# Patient Record
Sex: Female | Born: 2000 | Race: White | Hispanic: No | Marital: Single | Smoking: Current every day smoker
Health system: Southern US, Community
[De-identification: ages and names within clinical notes are randomized; demographics above are authoritative.]

## PROBLEM LIST (undated history)

## (undated) DIAGNOSIS — R002 Palpitations: Secondary | ICD-10-CM

## (undated) DIAGNOSIS — G43909 Migraine, unspecified, not intractable, without status migrainosus: Secondary | ICD-10-CM

## (undated) DIAGNOSIS — F419 Anxiety disorder, unspecified: Secondary | ICD-10-CM

## (undated) HISTORY — DX: Migraine, unspecified, not intractable, without status migrainosus: G43.909

## (undated) HISTORY — DX: Anxiety disorder, unspecified: F41.9

## (undated) HISTORY — DX: Palpitations: R00.2

---
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
## 2018-01-11 ENCOUNTER — Other Ambulatory Visit: Payer: Self-pay | Admitting: Family Medicine

## 2018-01-11 ENCOUNTER — Telehealth: Payer: Self-pay | Admitting: Nurse Practitioner

## 2018-01-11 DIAGNOSIS — F419 Anxiety disorder, unspecified: Secondary | ICD-10-CM

## 2018-01-11 NOTE — Telephone Encounter (Signed)
Pt needs appointment to receive any additional refills. 2 week supply is provided.

## 2018-01-11 NOTE — Telephone Encounter (Signed)
Will you please call and see if patient can come sometimes this next week for follow-up for lexapro.

## 2018-01-14 NOTE — Telephone Encounter (Signed)
°  Called [phone] was not able to leave message due to voice mail not being set up.

## 2018-01-29 ENCOUNTER — Other Ambulatory Visit: Payer: Self-pay | Admitting: Family Medicine

## 2018-01-29 DIAGNOSIS — F419 Anxiety disorder, unspecified: Secondary | ICD-10-CM

## 2018-01-30 NOTE — Telephone Encounter (Signed)
7-day supply provided.  She has already been contacted twice, please call again to set up an appointment. After this 7 day supply we will not be providing any additional refills until she comes for an appointment.

## 2018-02-13 NOTE — Telephone Encounter (Signed)
Called patient--voice mail box not set up

## 2019-03-06 ENCOUNTER — Other Ambulatory Visit: Payer: Self-pay | Admitting: Physician Assistant

## 2019-03-07 NOTE — Telephone Encounter (Signed)
Patient with last PE 01/2018.  Needs in person visit prior to more refills after this one.

## 2019-03-11 ENCOUNTER — Ambulatory Visit (LOCAL_COMMUNITY_HEALTH_CENTER): Payer: Medicaid Other | Admitting: Family Medicine

## 2019-03-11 ENCOUNTER — Encounter: Payer: Self-pay | Admitting: Family Medicine

## 2019-03-11 ENCOUNTER — Other Ambulatory Visit: Payer: Self-pay

## 2019-03-11 VITALS — BP 124/79 | Ht 63.0 in | Wt 150.0 lb

## 2019-03-11 DIAGNOSIS — Z3009 Encounter for other general counseling and advice on contraception: Secondary | ICD-10-CM | POA: Diagnosis not present

## 2019-03-11 DIAGNOSIS — Z30011 Encounter for initial prescription of contraceptive pills: Secondary | ICD-10-CM | POA: Diagnosis not present

## 2019-03-11 DIAGNOSIS — Z3041 Encounter for surveillance of contraceptive pills: Secondary | ICD-10-CM

## 2019-03-11 MED ORDER — NORGESTIMATE-ETH ESTRADIOL 0.25-35 MG-MCG PO TABS: 1.0000 | ORAL_TABLET | Freq: Every day | ORAL | 11 refills | Status: DC

## 2019-03-11 NOTE — Progress Notes (Signed)
Pt here for more birth control pills. Pt reports the only time she missed her OCP's was back in 05/2018 when she lost her pills and missed pills for about 1 month. Pt reports having regular periods since then.Ronny Bacon, RN

## 2019-03-11 NOTE — Progress Notes (Signed)
   [place] problem visit  [place] Department  Subjective:  Carly Carlson is a 18 y.o. being seen today for   Chief Complaint  Patient presents with  . Contraception    birth control pills    HPI  Here for birth control pills.  No concerns voiced.   Does the patient have a current or past history of drug use? No   No components found for: HCV]   Health Maintenance Due  Topic Date Due  . HIV Screening  09/28/2013  . TETANUS/TDAP  09/28/2017  . INFLUENZA VACCINE  12/21/2018    ROS  The following portions of the patient's history were reviewed and updated as appropriate: allergies, current medications, past family history, past medical history, past social history, past surgical history and problem list. Problem list updated.   See flowsheet for other program required questions.  Objective:   Vitals:   03/11/19 0925  BP: 124/79  Weight: 150 lb (68 kg)  Height: 5\' 3"  (1.6 m)    Physical Exam  Not indicated.    Assessment and Plan:  Carly Carlson is a 18 y.o. female presenting to the [facility] Department for a Women's Health problem visit  1. General counseling and advice on contraceptive management   2. Surveillance of previously prescribed contraceptive pill - norgestimate-ethinyl estradiol (ORTHO-CYCLEN) 0.25-35 MG-MCG tablet; Take 1 tablet by mouth daily.  Dispense: 1 Package; Refill: 11     No follow-ups on file.  No future appointments.  Hassell Done, FNP

## 2019-03-12 ENCOUNTER — Encounter: Payer: Self-pay | Admitting: Family Medicine

## 2019-03-12 MED ORDER — NORGESTIMATE-ETH ESTRADIOL 0.25-35 MG-MCG PO TABS: 1.0000 | ORAL_TABLET | Freq: Every day | ORAL | 11 refills | Status: DC

## 2019-03-12 NOTE — Progress Notes (Signed)
Pt left prior to [employer]. Per Hassell Done, FNP rx for OCP's was sent to pt's pharmacy on file.Ronny Bacon, RN

## 2019-03-15 IMAGING — CR DG CHEST 2V
2 series · 2 of 2 positions shown · non-contrast
Comparison: None.

CLINICAL DATA: Central chest pain

EXAM:
CHEST  2 VIEW

[chest pa]
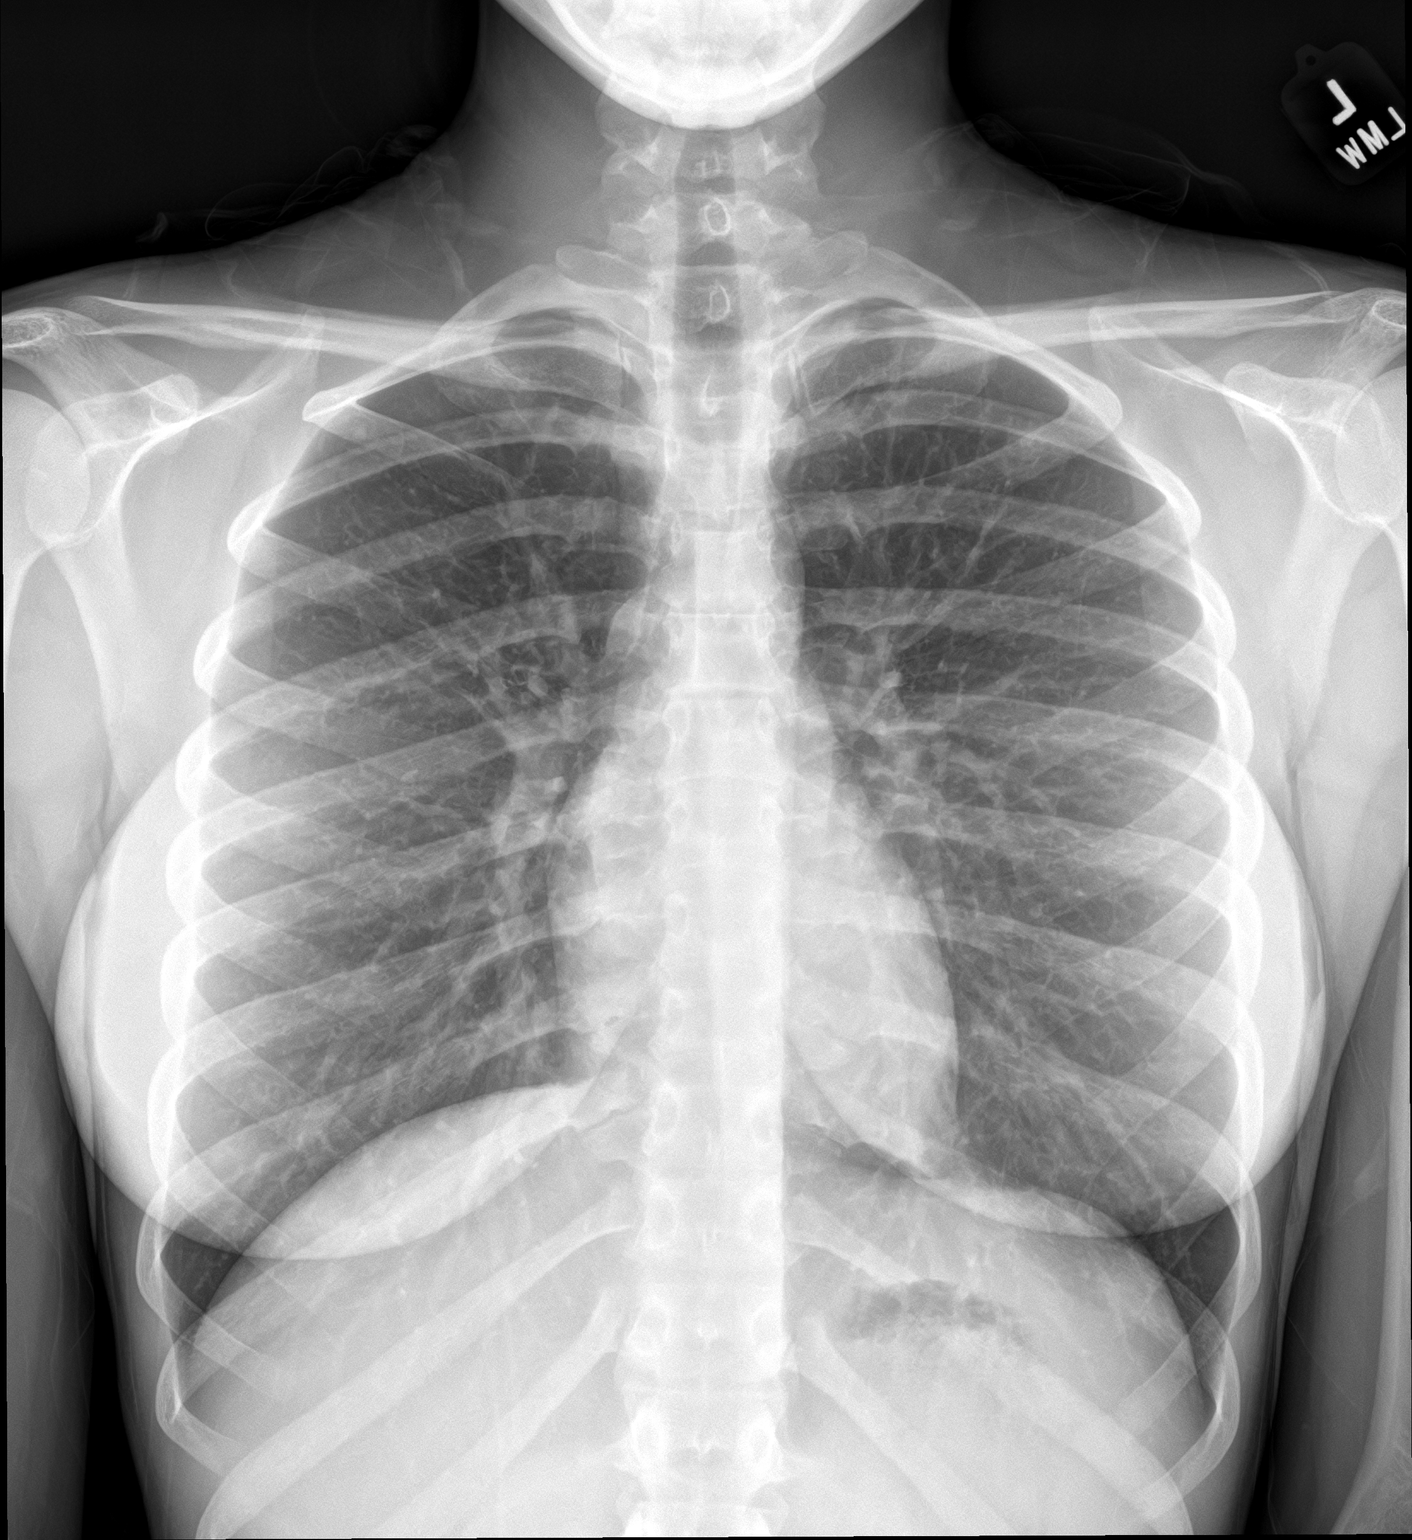

[chest lat]
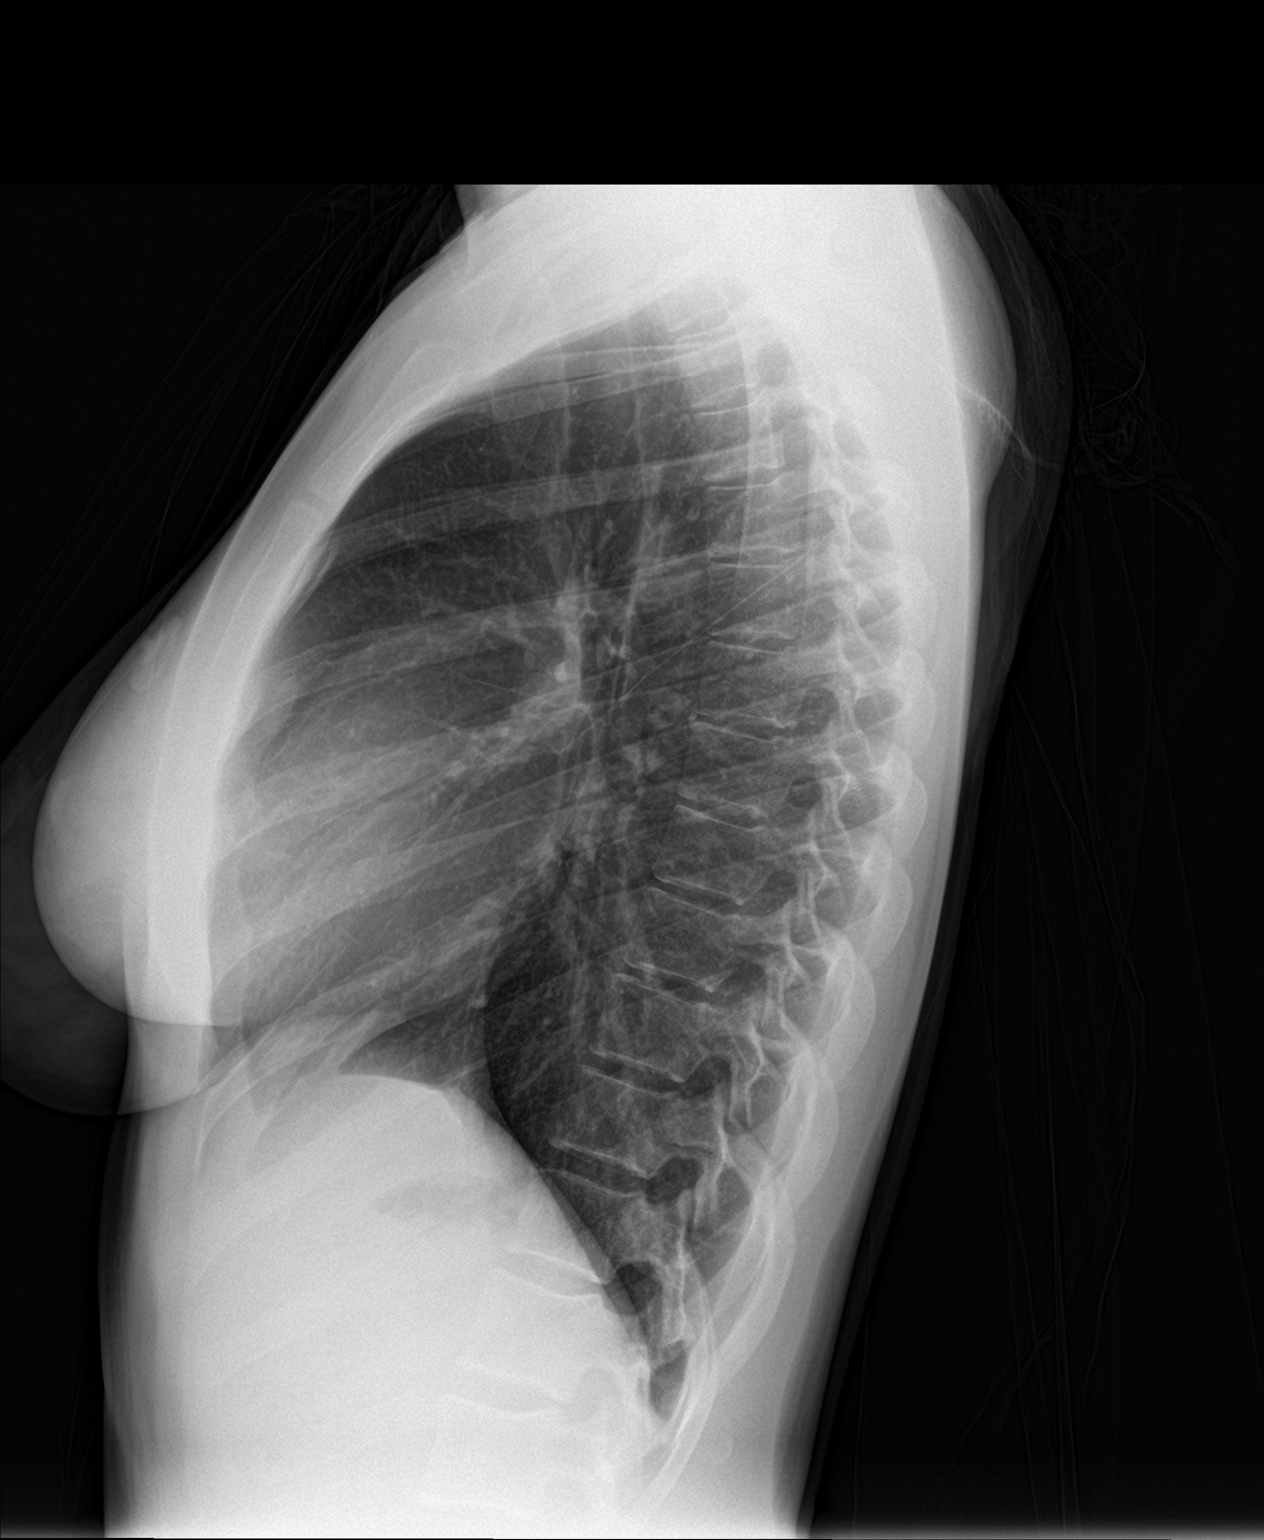

[2 of 2 positions shown; findings below may reference images not displayed]

FINDINGS: The heart size and mediastinal contours are within normal limits.
Both lungs are clear. The visualized skeletal structures are
unremarkable.
IMPRESSION: No active cardiopulmonary disease.

## 2019-03-16 IMAGING — CR DG CHEST 2V
1 series · 2 of 2 positions shown · non-contrast
Comparison: 07/12/2017

CLINICAL DATA: Rapid heart rate

EXAM:
CHEST  2 VIEW

[Series 1: dg chest 2 view · 0.14mm/px · 2 of 2 slices shown]
[im 1/2]
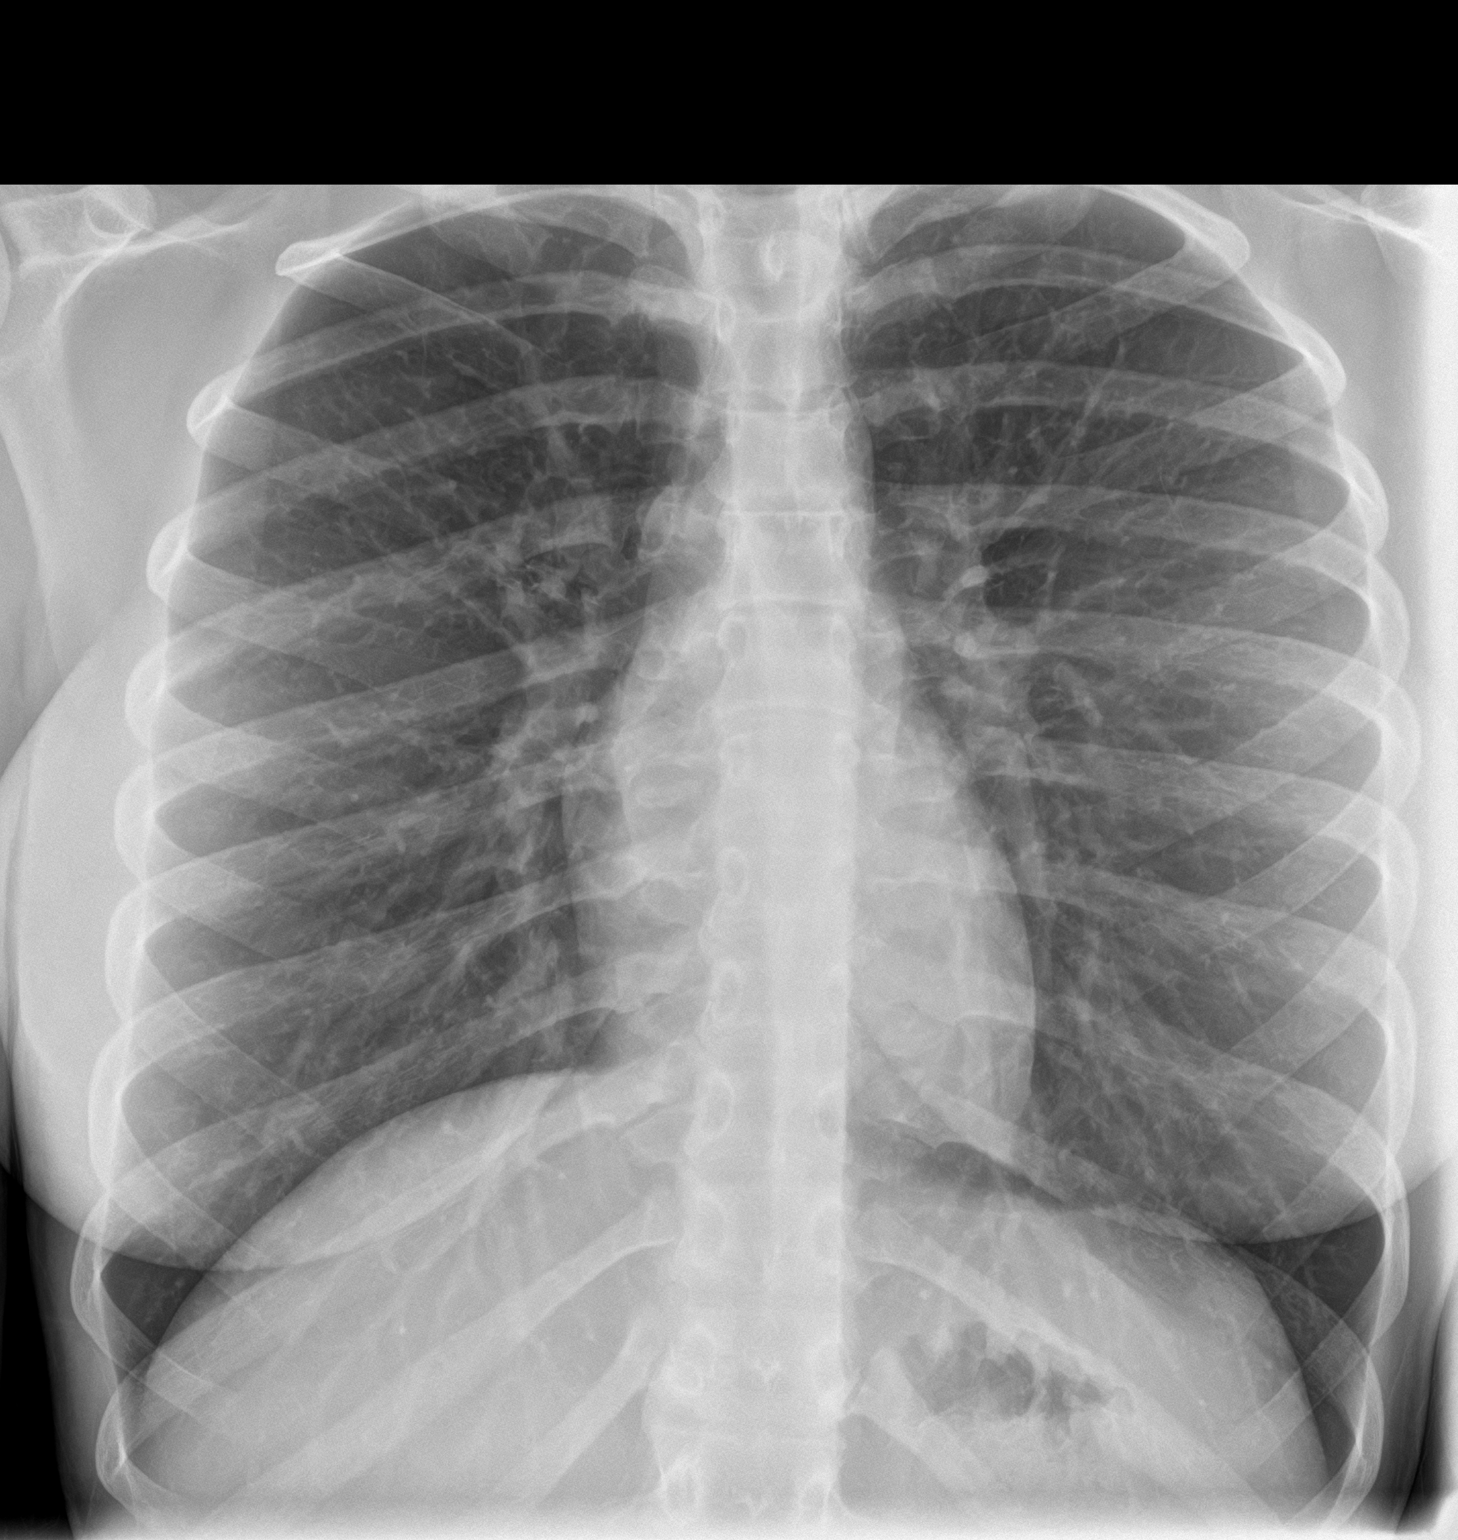
[im 2/2]
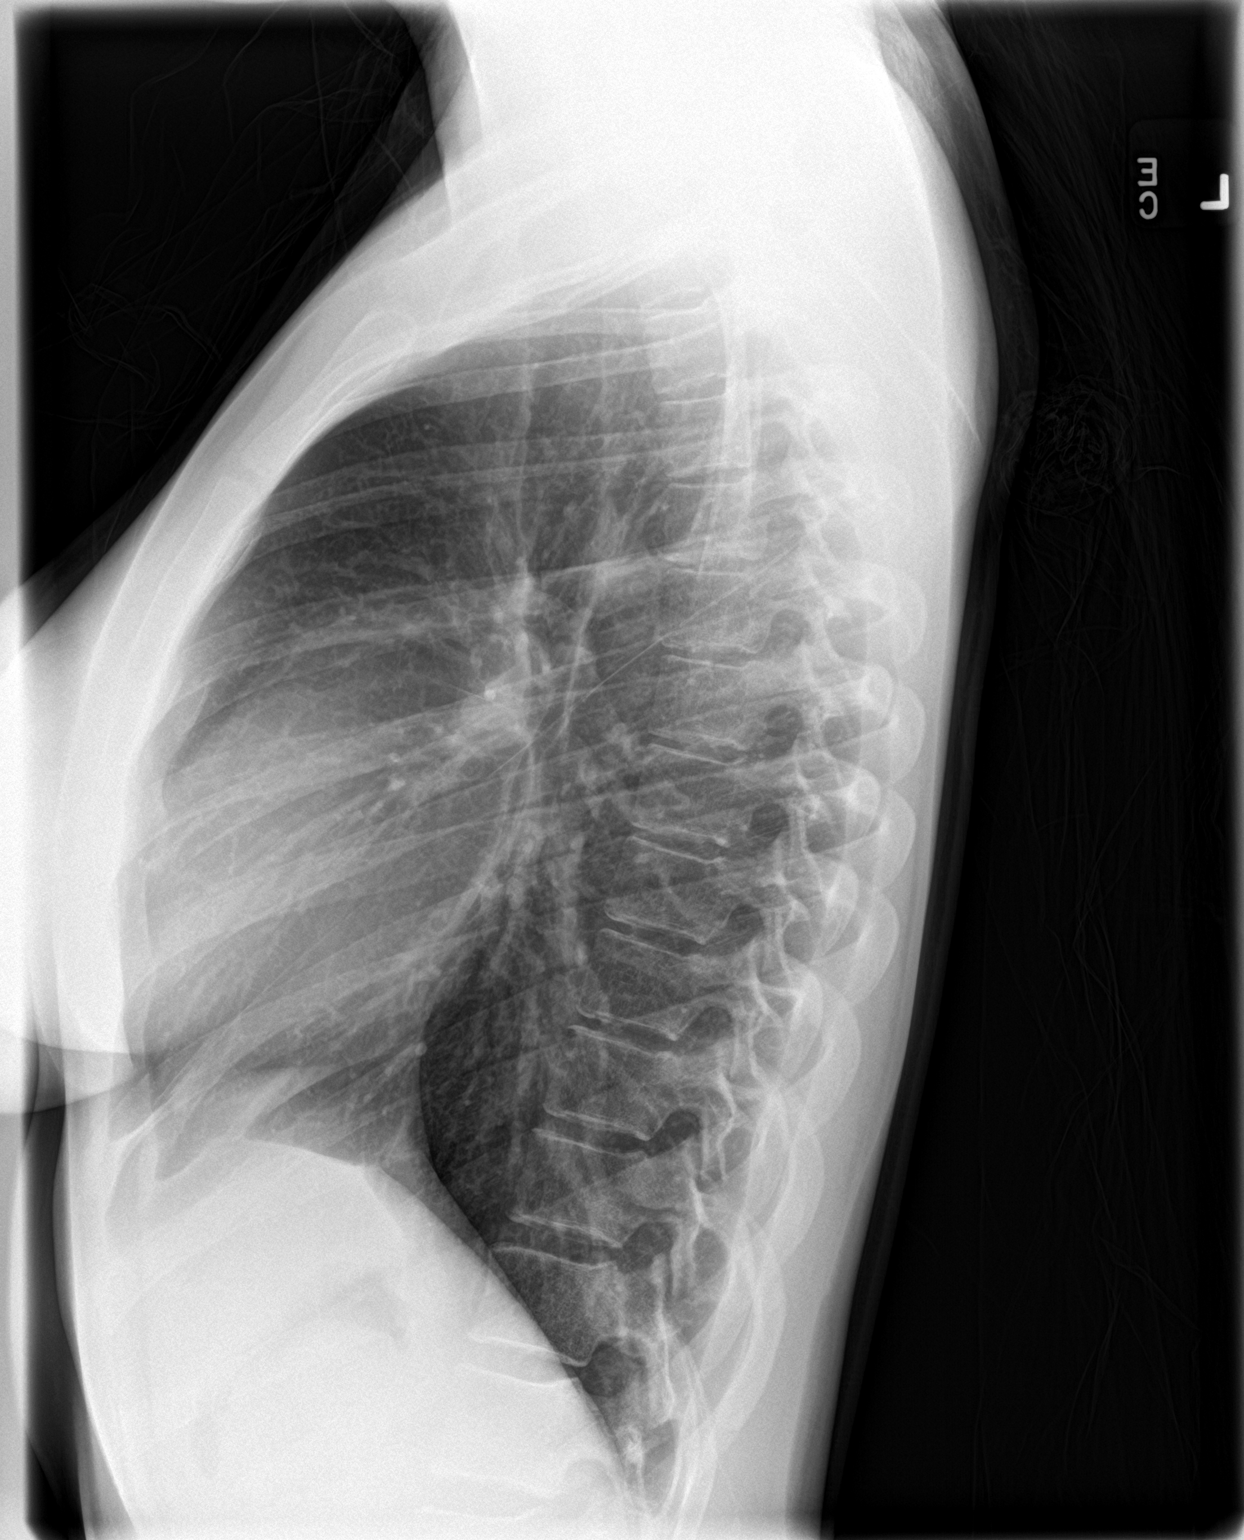

[2 of 2 positions shown; findings below may reference images not displayed]

FINDINGS: Mild central airways thickening which may be seen with reactive
airways. No focal pulmonary opacity or effusion. Normal heart size.
No pneumothorax.
IMPRESSION: No active cardiopulmonary disease.

## 2020-04-04 ENCOUNTER — Other Ambulatory Visit: Payer: Self-pay | Admitting: Physician Assistant

## 2020-04-04 DIAGNOSIS — Z3041 Encounter for surveillance of contraceptive pills: Secondary | ICD-10-CM

## 2020-04-04 NOTE — Telephone Encounter (Signed)
Per chart review, patient with last RP 01/2018 and last in-person visit 03/11/2019.  Will authorize one refill for patient but will need an in-person RP visit prior to further refills.

## 2020-04-28 ENCOUNTER — Other Ambulatory Visit: Payer: Self-pay

## 2020-04-28 ENCOUNTER — Ambulatory Visit (LOCAL_COMMUNITY_HEALTH_CENTER): Payer: BC Managed Care – PPO | Admitting: Advanced Practice Midwife

## 2020-04-28 ENCOUNTER — Ambulatory Visit: Payer: Medicaid Other

## 2020-04-28 VITALS — BP 137/90 | HR 80 | Ht 62.0 in | Wt 147.6 lb

## 2020-04-28 DIAGNOSIS — E663 Overweight: Secondary | ICD-10-CM | POA: Diagnosis not present

## 2020-04-28 DIAGNOSIS — R030 Elevated blood-pressure reading, without diagnosis of hypertension: Secondary | ICD-10-CM | POA: Diagnosis not present

## 2020-04-28 DIAGNOSIS — G43909 Migraine, unspecified, not intractable, without status migrainosus: Secondary | ICD-10-CM | POA: Insufficient documentation

## 2020-04-28 DIAGNOSIS — Z3041 Encounter for surveillance of contraceptive pills: Secondary | ICD-10-CM

## 2020-04-28 DIAGNOSIS — Z3009 Encounter for other general counseling and advice on contraception: Secondary | ICD-10-CM

## 2020-04-28 DIAGNOSIS — Z720 Tobacco use: Secondary | ICD-10-CM | POA: Insufficient documentation

## 2020-04-28 LAB — WET PREP FOR TRICH, YEAST, CLUE
Trichomonas Exam: NEGATIVE
Yeast Exam: NEGATIVE

## 2020-04-28 MED ORDER — NORETHINDRONE 0.35 MG PO TABS: 1.0000 | ORAL_TABLET | Freq: Every day | ORAL | 0 refills | Status: DC

## 2020-04-28 NOTE — Progress Notes (Signed)
[facility] DEPARTMENT [facility] [address] Main Number: [phone]    Family Planning Visit- Initial Visit  Subjective:  Carly Carlson is a 19 y.o. SWF vaper  [id number]   being seen today for an initial well woman visit and to discuss family planning options.  She is currently using Combination OCPs for pregnancy prevention. Patient reports she does not want a pregnancy in the next year.  Patient has the following medical conditions has Anxiety; Disturbed sleep rhythm; and Overweight BMI=27 on their problem list.  Chief Complaint  Patient presents with  . Annual Exam    Patient reports LMP mid November 2021.  Last sex 04/25/20 without condom; with current partner x 5 years; 1 partner in last 3 mo.  Last ETOH 02/2020 (4 beers) 1x/mo.  Last cigar 4 years ago.  Vaper.  Onset coitus age 9 with 2 sex lifetime partners.  Living with mom and 24 yo brother.  Working 35-40 hours/wk.  Last MJ 5 years ago.  Patient denies cigs  Body mass index is 27 kg/m. - Patient is eligible for diabetes screening based on BMI and age >41?  not applicable HA1C ordered? not applicable  Patient reports 1 of partners in last year. Desires STI screening?  Yes  Has patient been screened once for HCV in the past?  No  No results found for: HCVAB  Does the patient have current drug use (including MJ), have a partner with drug use, and/or has been incarcerated since last result? No  If yes-- Screen for HCV through [facility]   Does the patient meet criteria for HBV testing? No  Criteria:  -Household, sexual or needle sharing contact with HBV -History of drug use -HIV positive -Those with known Hep C   Health Maintenance Due  Topic Date Due  . Hepatitis C Screening  Never done  . COVID-19 Vaccine (1) Never done  . HIV Screening  Never done  . TETANUS/TDAP  Never done  . PAP-Cervical Cytology Screening  Never done  . PAP SMEAR-Modifier  Never done  .  INFLUENZA VACCINE  Never done    Review of Systems  Neurological: Positive for headaches (q month in different places on head, relieved with Ibuprofen, -N&V,-audio,-visual).  All other systems reviewed and are negative.   The following portions of the patient's history were reviewed and updated as appropriate: allergies, current medications, past family history, past medical history, past social history, past surgical history and problem list. Problem list updated.   See flowsheet for other program required questions.  Objective:   Vitals:   04/28/20 1113 04/28/20 1134  BP: (!) 146/99 137/90  Pulse: (!) 106 80  Weight: 147 lb 9.6 oz (67 kg)   Height: 5\' 2"  (1.575 m)     Physical Exam Constitutional:      Appearance: Normal appearance. She is normal weight.  HENT:     Head: Normocephalic and atraumatic.     Mouth/Throat:     Mouth: Mucous membranes are moist.  Eyes:     Conjunctiva/sclera: Conjunctivae normal.  Cardiovascular:     Rate and Rhythm: Normal rate and regular rhythm.  Pulmonary:     Effort: Pulmonary effort is normal.     Breath sounds: Normal breath sounds.  Chest:     Breasts:        Right: Normal.        Left: Normal.  Abdominal:     Palpations: Abdomen is soft.  Comments: Soft without masses or tenderness, good tone  Genitourinary:    General: Normal vulva.     Exam position: Lithotomy position.     Vagina: Vaginal discharge (white creamy leukorrhea, ph<4.5) present.     Cervix: Normal.     Uterus: Normal.      Adnexa: Right adnexa normal and left adnexa normal.     Rectum: Normal.  Musculoskeletal:        General: Normal range of motion.     Cervical back: Normal range of motion and neck supple.  Skin:    General: Skin is warm and dry.  Neurological:     Mental Status: She is alert.  Psychiatric:        Mood and Affect: Mood normal.       Assessment and Plan:  Carly Carlson is a 19 y.o. female presenting to the [facility] Department for an initial well woman exam/family planning visit  Contraception counseling: Reviewed all forms of birth control options in the tiered based approach. available including abstinence; over the counter/barrier methods; hormonal contraceptive medication including pill, patch, ring, injection,contraceptive implant, ECP; hormonal and nonhormonal IUDs; permanent sterilization options including vasectomy and the various tubal sterilization modalities. Risks, benefits, and typical effectiveness rates were reviewed.  Questions were answered.  Written information was also given to the patient to review.  Patient desires ocp's, this was prescribed for patient. She will follow up in 2 wks for surveillance.  She was told to call with any further questions, or with any concerns about this method of contraception.  Emphasized use of condoms 100% of the time for STI prevention.  Patient was offered ECP. ECP was not accepted by the patient. ECP counseling was not given - see RN documentation  1. Overweight BMI=27   2. Family planning Treat wet mount per standing order Immunization nurse consult Please give pt primary care MD list Please write both BP's down for pt to give to primary care MD to f/u RTC 2 wks for BP check and ask if has Medicaid.  If has Medicaid then needs e-rx for Micronor for 1 year.  If no Medicaid then will need to be given 1 years worth of Micronor Counseled via 5 A's to stop vaping - WET PREP FOR TRICH, YEAST, CLUE - Chlamydia/Gonorrhea [facility] - Pap IG (Image Guided)  3. Encounter for surveillance of contraceptive pills Micronor #13 I po daily to begin when completes this current pack     No follow-ups on file.  No future appointments.  Alberteen Spindle, CNM

## 2020-04-29 LAB — PAP IG (IMAGE GUIDED): PAP Smear Comment: 0

## 2020-05-12 ENCOUNTER — Other Ambulatory Visit: Payer: Self-pay

## 2020-05-12 ENCOUNTER — Ambulatory Visit (LOCAL_COMMUNITY_HEALTH_CENTER): Payer: BC Managed Care – PPO | Admitting: Physician Assistant

## 2020-05-12 VITALS — BP 143/88 | HR 105 | Wt 144.8 lb

## 2020-05-12 DIAGNOSIS — Z30011 Encounter for initial prescription of contraceptive pills: Secondary | ICD-10-CM | POA: Diagnosis not present

## 2020-05-12 DIAGNOSIS — Z3009 Encounter for other general counseling and advice on contraception: Secondary | ICD-10-CM | POA: Diagnosis not present

## 2020-05-12 MED ORDER — NORETHINDRONE 0.35 MG PO TABS: 1.0000 | ORAL_TABLET | Freq: Every day | ORAL | 11 refills | Status: DC

## 2020-05-12 NOTE — Progress Notes (Signed)
Folic acid counseling completed. Reports still has PCP list received at visit on 04/28/2020. Jossie Ng, RN

## 2020-05-12 NOTE — Progress Notes (Signed)
S: Pt here for f/u contraception visit. Started progestin-only pill (Micronor) 04/28/20 in light of elevated BP and vaping nicotine. Has been taking at 9pm without missed doses, no side effects. Wants to continue. Has not had PCP eval of elevated BP yet. Notes h/o anxiety-related BP elevation, was on Lexapro for 3 mo about 2 years ago for this, but discontinued due to loss of medical insurance. Now has insurance for contraception. Feels well today, no new concerns. O: WNWD young woman in NAD. Vital signs reviewed with BP 143/88 (was 137/90 at 12/8 visit). A/P: 1. Family planning services Enc to have PCP eval of elevated BP, and discontinue vaping. Annual well-woman exam due in 1 year. - norethindrone (MICRONOR) 0.35 MG tablet; Take 1 tablet (0.35 mg total) by mouth daily.  Dispense: 28 tablet; Refill: 11

## 2020-07-16 ENCOUNTER — Other Ambulatory Visit: Payer: Self-pay | Admitting: Physician Assistant

## 2020-07-16 DIAGNOSIS — Z3009 Encounter for other general counseling and advice on contraception: Secondary | ICD-10-CM

## 2021-07-07 ENCOUNTER — Other Ambulatory Visit: Payer: Self-pay | Admitting: Family Medicine

## 2021-07-07 ENCOUNTER — Telehealth: Payer: Self-pay | Admitting: Family Medicine

## 2021-07-07 DIAGNOSIS — Z3041 Encounter for surveillance of contraceptive pills: Secondary | ICD-10-CM

## 2021-07-07 MED ORDER — NORETHINDRONE 0.35 MG PO TABS: 1.0000 | ORAL_TABLET | Freq: Every day | ORAL | 0 refills | Status: DC

## 2021-07-07 NOTE — Telephone Encounter (Signed)
I ran out of [facility] pills yesterday but my PE isn't until March 1. Any suggestions?

## 2021-07-07 NOTE — Progress Notes (Signed)
Pt called and ran out of OCP's, pt is scheduled for PE on 07/20/2021.    Pt informed of need of PE before more pills can be prescribed.   1. Encounter for surveillance of contraceptive pills - norethindrone (MICRONOR) 0.35 MG tablet; Take 1 tablet (0.35 mg total) by mouth daily.  Dispense: 28 tablet; Refill: 0   Wendi Snipes, FNP

## 2021-07-20 ENCOUNTER — Ambulatory Visit: Payer: BC Managed Care – PPO

## 2021-09-25 ENCOUNTER — Other Ambulatory Visit: Payer: Self-pay | Admitting: Family Medicine

## 2021-09-25 DIAGNOSIS — Z3009 Encounter for other general counseling and advice on contraception: Secondary | ICD-10-CM

## 2021-09-26 ENCOUNTER — Telehealth: Payer: Self-pay | Admitting: Family Medicine

## 2021-09-26 NOTE — Telephone Encounter (Signed)
Trying to reschedule appt but my job willnot let me off work ?

## 2021-09-26 NOTE — Telephone Encounter (Signed)
Telephone call to patient regarding her desires for more OCP's and the need for a PE prior to getting a OCP Rx.  Patient reports she has a difficult time getting approved for time off work.  She has 3 different days she can come and a short timeframe, but she only has 3 OCP's left.  Patient has been scheduled for Wednesday 09-28-2021 at 9:40 am (9:10 arrival time).  Hart Carwin, RN ? ?

## 2021-09-28 ENCOUNTER — Ambulatory Visit (LOCAL_COMMUNITY_HEALTH_CENTER): Payer: BC Managed Care – PPO | Admitting: Nurse Practitioner

## 2021-09-28 ENCOUNTER — Encounter: Payer: Self-pay | Admitting: Nurse Practitioner

## 2021-09-28 VITALS — BP 131/83 | Ht 62.0 in | Wt 162.0 lb

## 2021-09-28 DIAGNOSIS — Z0000 Encounter for general adult medical examination without abnormal findings: Secondary | ICD-10-CM | POA: Diagnosis not present

## 2021-09-28 DIAGNOSIS — Z3009 Encounter for other general counseling and advice on contraception: Secondary | ICD-10-CM | POA: Diagnosis not present

## 2021-09-28 MED ORDER — NORETHINDRONE 0.35 MG PO TABS: 1.0000 | ORAL_TABLET | Freq: Every day | ORAL | 12 refills | Status: DC

## 2021-09-28 NOTE — Progress Notes (Signed)
[facility] DEPARTMENT ?Family Planning Clinic ?[address] [employer] ?Main Number: [phone] ? ? ? ?Family Planning Visit- Initial Visit ? ?Subjective:  ?Carly Carlson is a 21 y.o.  G0P0000   being seen today for an initial annual visit and to discuss reproductive life planning.  The patient is currently using Oral Contraceptive for pregnancy prevention. Patient reports   does not want a pregnancy in the next year.   ? ? report they are looking for a method that provides Cycle control, High efficacy at preventing pregnancy, and Method they can control starting/stopping ? ?Patient has the following medical conditions has Anxiety; Disturbed sleep rhythm; Overweight BMI=27; Elevated blood pressure reading 04/28/20  146/99, 137/90; Migraines; and Vapes nicotine containing substance on their problem list. ? ?Chief Complaint  ?Patient presents with  ? Annual Exam  ?  Annual exam and birth control  ? ? ?Patient reports to clinic today for a physical and birth control.  ? ?Patient denies signs and symptoms   ? ?Body mass index is 29.63 kg/m?. - Patient is eligible for diabetes screening based on BMI and age >44?  not applicable ?HA1C ordered? not applicable ? ?Patient reports 1  partner/s in last year. Desires STI screening?  Yes ? ?Has patient been screened once for HCV in the past?  No ? No results found for: HCVAB ? ?Does the patient have current drug use (including MJ), have a partner with drug use, and/or has been incarcerated since last result? No  ?If yes-- Screen for HCV through [facility] ?  ?Does the patient meet criteria for HBV testing? Yes, history of use, not currently using.  ? ?Criteria:  ?-Household, sexual or needle sharing contact with HBV ?-History of drug use ?-HIV positive ?-Those with known Hep C ? ? ?Health Maintenance Due  ?Topic Date Due  ? COVID-19 Vaccine (1) Never done  ? HPV VACCINES (1 - 2-dose series) Never done  ? HIV Screening  Never done  ? Hepatitis C Screening   Never done  ? TETANUS/TDAP  Never done  ? ? ?Review of Systems  ?Constitutional:  Negative for chills, fever, malaise/fatigue and weight loss.  ?HENT:  Negative for congestion, hearing loss and sore throat.   ?Eyes:  Negative for blurred vision, double vision and photophobia.  ?Respiratory:  Negative for shortness of breath.   ?Cardiovascular:  Negative for chest pain.  ?Gastrointestinal:  Negative for abdominal pain, blood in stool, constipation, diarrhea, heartburn, nausea and vomiting.  ?Genitourinary:  Negative for dysuria and frequency.  ?Musculoskeletal:  Negative for back pain, joint pain and neck pain.  ?Skin:  Negative for itching and rash.  ?Neurological:  Negative for dizziness, weakness and headaches.  ?Endo/Heme/Allergies:  Does not bruise/bleed easily.  ?Psychiatric/Behavioral:  Negative for depression, substance abuse and suicidal ideas.   ? ?The following portions of the patient's history were reviewed and updated as appropriate: allergies, current medications, past family history, past medical history, past social history, past surgical history and problem list. Problem list updated. ? ? ?See flowsheet for other program required questions. ? ?Objective:  ? ?Vitals:  ? 09/28/21 0941  ?BP: 131/83  ?Weight: 162 lb (73.5 kg)  ?Height: 5\' 2"  (1.575 m)  ? ? ?Physical Exam ?Constitutional:   ?   Appearance: Normal appearance.  ?HENT:  ?   Head: Normocephalic.  ?   Right Ear: External ear normal.  ?   Left Ear: External ear normal.  ?   Nose: Nose  normal.  ?   Mouth/Throat:  ?   Lips: Pink.  ?   Mouth: Mucous membranes are moist.  ?   Comments: No visible signs of dental caries ?Eyes:  ?   Pupils: Pupils are equal, round, and reactive to light.  ?Cardiovascular:  ?   Rate and Rhythm: Normal rate and regular rhythm.  ?Pulmonary:  ?   Effort: Pulmonary effort is normal.  ?   Breath sounds: Normal breath sounds.  ?Chest:  ?   Comments: Breasts:  ?      Right: Normal. No swelling, mass, nipple discharge, skin  change or tenderness.  ?      Left: Normal. No swelling, mass, nipple discharge, skin change or tenderness.   ?Abdominal:  ?   General: Abdomen is flat. Bowel sounds are normal.  ?   Palpations: Abdomen is soft.  ?Genitourinary: ?   Comments: Deferred, declines genital exam ?Musculoskeletal:  ?   Cervical back: Full passive range of motion without pain, normal range of motion and neck supple.  ?Skin: ?   General: Skin is warm and dry.  ?Neurological:  ?   Mental Status: She is alert and oriented to person, place, and time.  ?Psychiatric:     ?   Attention and Perception: Attention normal.     ?   Mood and Affect: Mood normal.     ?   Speech: Speech normal.     ?   Behavior: Behavior normal. Behavior is cooperative.  ? ? ? ? ?Assessment and Plan:  ?Carly Carlson is a 21 y.o. female presenting to the [facility] Department for an initial annual wellness/contraceptive visit ? ?Contraception counseling: Reviewed options based on patient desire and reproductive life plan. Patient is interested in Oral Contraceptive. This was provided to the patient today.  ? ?Risks, benefits, and typical effectiveness rates were reviewed.  Questions were answered.  Written information was also given to the patient to review.   ? ?The patient will follow up in  1 years for surveillance.  The patient was told to call with any further questions, or with any concerns about this method of contraception.  Emphasized use of condoms 100% of the time for STI prevention. ? ?Need for ECP was assessed. No ECP needed due to continuous use of birth control.   ? ?1. Family planning counseling ?-21 year old female in clinic today for a physical and birth control. ?-ROS reviewed, no complaints. ?-Patient desires to continue with birth control method.  Last pill taken on 09/27/21.  Prescription sent for Micronor 1 PO daily #13.   ?-Patient denies STD screening and blood work today. ? ?- norethindrone (MICRONOR) 0.35 MG tablet; Take 1 tablet  (0.35 mg total) by mouth daily.  Dispense: 28 tablet; Refill: 12 ? ? ?2. Well woman exam (no gynecological exam) ?-Normal well woman exam ?-Next CBE 09/2024 ?-PAP due 04/2023 ? ? ? ?Return in about 1 year (around 09/29/2022) for Annual well-woman exam. ? ? ? ?Glenna Fellows, FNP ?

## 2021-09-28 NOTE — Progress Notes (Signed)
Pt here for annual PE and birth control.  Declines STD testing.  Rx for OCPs sent to pharmacy by provider.  Condoms declined.Forest Becker, RN. ?

## 2021-10-03 ENCOUNTER — Ambulatory Visit: Payer: BC Managed Care – PPO

## 2022-08-28 ENCOUNTER — Other Ambulatory Visit: Payer: Self-pay | Admitting: Nurse Practitioner

## 2022-08-28 DIAGNOSIS — Z3009 Encounter for other general counseling and advice on contraception: Secondary | ICD-10-CM

## 2022-08-30 NOTE — Telephone Encounter (Signed)
Patient states that she needs a refill on her contraception. She ran out Monday and stated that she called the pharmacy and she was told that she needed to contact her provider to get a refill. [facility] is [facility] [address].

## 2022-08-31 ENCOUNTER — Telehealth: Payer: Self-pay | Admitting: Family Medicine

## 2022-09-01 ENCOUNTER — Ambulatory Visit (LOCAL_COMMUNITY_HEALTH_CENTER): Payer: BC Managed Care – PPO

## 2022-09-01 VITALS — BP 135/84 | Ht 62.0 in | Wt 154.0 lb

## 2022-09-01 DIAGNOSIS — Z3041 Encounter for surveillance of contraceptive pills: Secondary | ICD-10-CM

## 2022-09-01 DIAGNOSIS — Z3009 Encounter for other general counseling and advice on contraception: Secondary | ICD-10-CM

## 2022-09-01 DIAGNOSIS — Z308 Encounter for other contraceptive management: Secondary | ICD-10-CM | POA: Diagnosis not present

## 2022-09-01 MED ORDER — NORETHINDRONE 0.35 MG PO TABS: 1.0000 | ORAL_TABLET | Freq: Every day | ORAL | 1 refills | Status: AC

## 2022-09-01 NOTE — Progress Notes (Signed)
Client seen in nurse clinic for OCP refill, she reported that took last pill Monday 08/28/2022 and is currently out of pills. Reports that she is taking the pill at the same time every day, and reports no missed pill except,  this week due to being out of pills. (Missed pills Tuesday 4/9, Wednesday 4/10, Thursday 4/11 and today 4/12.  Reports last sex was Wednesday 4/10.  LMP 2nd week in January.   Reports that her period is very irregular.   Consulted with Lenice Llamas, FNP and she recommended that client use home pregnancy test if no period in 2 weeks and call the clinic.  Reminded client she should use a back-up birth control method for 7 days, condoms offered but declined.  Lenice Llamas, [place] called [facility] with one refill.  Client has appointment for Physical Exam on 10/03/2013.   Client verbalized understanding and no further questions.  Carly Brittingham Sherrilyn Rist, RN

## 2022-09-21 ENCOUNTER — Other Ambulatory Visit: Payer: Self-pay | Admitting: Family Medicine

## 2022-09-21 DIAGNOSIS — Z3041 Encounter for surveillance of contraceptive pills: Secondary | ICD-10-CM

## 2022-10-04 ENCOUNTER — Ambulatory Visit: Payer: BC Managed Care – PPO | Admitting: Family Medicine

## 2022-10-04 ENCOUNTER — Encounter: Payer: Self-pay | Admitting: Family Medicine

## 2022-10-04 VITALS — BP 122/72 | HR 67 | Ht 62.0 in | Wt 156.4 lb

## 2022-10-04 DIAGNOSIS — Z308 Encounter for other contraceptive management: Secondary | ICD-10-CM | POA: Diagnosis not present

## 2022-10-04 DIAGNOSIS — Z3009 Encounter for other general counseling and advice on contraception: Secondary | ICD-10-CM

## 2022-10-04 DIAGNOSIS — Z0000 Encounter for general adult medical examination without abnormal findings: Secondary | ICD-10-CM

## 2022-10-04 MED ORDER — NORETHINDRONE 0.35 MG PO TABS: 1.0000 | ORAL_TABLET | Freq: Every day | ORAL | 12 refills | Status: AC

## 2022-10-04 NOTE — Progress Notes (Signed)
[facility] DEPARTMENT [facility] [address] Main Number: [phone]  Family Planning Visit- Repeat Yearly Visit  Subjective:  Carly Carlson is a 22 y.o. G0P0000  being seen today for an annual wellness visit and to discuss contraception options.   The patient is currently using Oral Contraceptive for pregnancy prevention. Patient does not want a pregnancy in the next year.    report they are looking for a method that provides Does not want something inserted   Patient has the following medical problems: has Anxiety; Disturbed sleep rhythm; Overweight BMI=27; Elevated blood pressure reading 04/28/20  146/99, 137/90; Migraines; and Vapes nicotine containing substance on their problem list.  Chief Complaint  Patient presents with   Annual Exam    PE and OCP's    Patient reports to clinic for PE and OCPs   Patient denies concerns about self today.    See flowsheet for other program required questions.   Body mass index is 28.61 kg/m. - Patient is eligible for diabetes screening based on BMI> 25 and age >35?  no HA1C ordered? not applicable  Patient reports 1 of partners in last year. Desires STI screening?  No - declines   Has patient been screened once for HCV in the past?  No  No results found for: "HCVAB"  Does the patient have current of drug use, have a partner with drug use, and/or has been incarcerated since last result? No  If yes-- Screen for HCV through [facility]   Does the patient meet criteria for HBV testing? No  Criteria:  -Household, sexual or needle sharing contact with HBV -History of drug use -HIV positive -Those with known Hep C   Health Maintenance Due  Topic Date Due   COVID-19 Vaccine (1) Never done   HPV VACCINES (1 - 2-dose series) Never done   HIV Screening  Never done   Hepatitis C Screening  Never done   DTaP/Tdap/Td (1 - Tdap) Never done    Review of Systems  Constitutional:  Negative  for weight loss.  Eyes:  Negative for blurred vision.  Respiratory:  Negative for cough and shortness of breath.   Cardiovascular:  Negative for claudication.  Gastrointestinal:  Negative for nausea.  Genitourinary:  Negative for dysuria and frequency.  Skin:  Negative for rash.  Neurological:  Negative for headaches.  Endo/Heme/Allergies:  Does not bruise/bleed easily.    The following portions of the patient's history were reviewed and updated as appropriate: allergies, current medications, past family history, past medical history, past social history, past surgical history and problem list. Problem list updated.  Objective:   Vitals:   10/04/22 0853  BP: 122/72  Pulse: 67  Weight: 156 lb 6.4 oz (70.9 kg)  Height: 5\' 2"  (1.575 m)    Physical Exam Constitutional:      Appearance: Normal appearance.  HENT:     Head: Normocephalic and atraumatic.  Pulmonary:     Effort: Pulmonary effort is normal.  Abdominal:     Palpations: Abdomen is soft.  Genitourinary:    Comments: Declined genital exam- no symptoms Musculoskeletal:        General: Normal range of motion.  Skin:    General: Skin is warm and dry.  Neurological:     General: No focal deficit present.     Mental Status: She is alert.  Psychiatric:        Mood and Affect: Mood normal.  Behavior: Behavior normal.     Assessment and Plan:  Carly Carlson is a 23 y.o. female G0P0000 presenting to the [facility] Department for an yearly wellness and contraception visit  1. Well woman exam (no gynecological exam) CBE due in 2026, last done in 2023 -pap test not due until 04/2023, counseled to RTC in December for repeat pap -no concerns today about self  2. Family planning Contraception counseling: Reviewed options based on patient desire and reproductive life plan. Patient is interested in Oral Contraceptive. This was provided to the patient today.  Risks, benefits, and typical effectiveness  rates were reviewed.  Questions were answered.  Written information was also given to the patient to review.    The patient will follow up in  1 years for surveillance.  The patient was told to call with any further questions, or with any concerns about this method of contraception.  Emphasized use of condoms 100% of the time for STI prevention.  Patient was assessed for need for ECP. Not indicated, on OCPs  - norethindrone (ORTHO MICRONOR) 0.35 MG tablet; Take 1 tablet (0.35 mg total) by mouth daily.  Dispense: 28 tablet; Refill: 12   Return in about 1 year (around 10/04/2023) for annual well-woman exam.  No future appointments.  Carly Carlson, [place]

## 2022-10-04 NOTE — Progress Notes (Signed)
Pt is here for PE and OCP's.  Pt declined STD screening.  FP packet given.  Berdie Ogren, RN

## 2022-12-28 ENCOUNTER — Telehealth: Payer: BC Managed Care – PPO | Admitting: Physician Assistant

## 2022-12-28 DIAGNOSIS — R109 Unspecified abdominal pain: Secondary | ICD-10-CM

## 2022-12-28 NOTE — Progress Notes (Signed)
Because we need to determine if this is bladder related or something else going on which requires examination and urine testing, I feel your condition warrants further evaluation and I recommend that you be seen in a face to face visit.   NOTE: There will be NO CHARGE for this eVisit   If you are having a true medical emergency please call 911.      For an urgent face to face visit, [facility] has eight urgent care centers for your convenience:   NEW!! [facility] Center at [facility] Get Driving Directions [phone] [address], [address] [place], [address]    [facility] Urgent Care Center at [place] [facility] Get Driving Directions [phone] [address] [place], [place] [address]   [facility] Urgent Care Center [facility]) Get Driving Directions [phone] [address] [place], [place] [address]  [facility] Health Urgent Care Center [facility]) Get Driving Directions [phone] [address] [place],  [place]  [address]  [facility] Urgent Care Center [facility] - at [employer]  [phone] [address] [place],  [place] [address]   [facility] Health Urgent Care at [facility] Get Driving Directions [phone] [address], [address] [place], [place] [address]   [facility] Health Urgent Care at [facility] Get Driving Directions  [phone] [address] [place], [place] [address]   [facility] Health Urgent Care at [facility] Directions [phone] [address], [address] [place], [place] [address]  Your MyChart E-visit questionnaire answers were reviewed by a board certified advanced clinical practitioner to complete your personal care plan based on your specific symptoms.  Thank you for using e-Visits.

## 2023-09-23 ENCOUNTER — Other Ambulatory Visit: Payer: Self-pay | Admitting: Family Medicine

## 2023-09-23 DIAGNOSIS — Z3009 Encounter for other general counseling and advice on contraception: Secondary | ICD-10-CM
